# Patient Record
Sex: Male | Born: 2005 | Race: White | Hispanic: No | Marital: Single | Smoking: Never smoker
Health system: Southern US, Community
[De-identification: ages and names within clinical notes are randomized; demographics above are authoritative.]

## PROBLEM LIST (undated history)

## (undated) HISTORY — PX: MYRINGOTOMY: SUR874

## (undated) HISTORY — PX: TONSILLECTOMY: SUR1361

---
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
## 2023-01-03 ENCOUNTER — Other Ambulatory Visit: Payer: Self-pay | Admitting: Internal Medicine

## 2023-01-03 NOTE — Telephone Encounter (Signed)
Requested Prescriptions  Pending Prescriptions Disp Refills   guanFACINE (INTUNIV) 2 MG TB24 ER tablet [Pharmacy Med Name: GUANFACINE ER 2MG  TABLETS] 30 tablet 0    Sig: TAKE 1 TABLET(2 MG) BY MOUTH DAILY     Cardiovascular: Alpha-2 Agonists - guanfacine Passed - 01/03/2023 11:57 AM      Passed - Last BP in normal range    BP Readings from Last 1 Encounters:  12/04/22 106/64         Passed - Last Heart Rate in normal range    Pulse Readings from Last 1 Encounters:  12/04/22 97         Passed - Valid encounter within last 6 months    Recent Outpatient Visits           1 month ago Encounter for general adult medical examination with abnormal findings   [facility] [place], Salvadore Oxford, NP   7 months ago Attention deficit hyperactivity disorder (ADHD), combined type   [place] [facility] [place], Salvadore Oxford, NP       Future Appointments             In 5 months Baity, Salvadore Oxford, NP  [facility], [facility]

## 2023-01-08 DIAGNOSIS — F4310 Post-traumatic stress disorder, unspecified: Secondary | ICD-10-CM | POA: Diagnosis not present

## 2023-01-20 ENCOUNTER — Encounter: Payer: Self-pay | Admitting: Internal Medicine

## 2023-01-21 MED ORDER — METHYLPHENIDATE HCL ER (OSM) 36 MG PO TBCR: 72.0000 mg | EXTENDED_RELEASE_TABLET | Freq: Every day | ORAL | 0 refills | Status: DC

## 2023-02-19 DIAGNOSIS — F4310 Post-traumatic stress disorder, unspecified: Secondary | ICD-10-CM | POA: Diagnosis not present

## 2023-02-20 ENCOUNTER — Other Ambulatory Visit: Payer: Self-pay | Admitting: Internal Medicine

## 2023-02-20 MED ORDER — METHYLPHENIDATE HCL ER (OSM) 36 MG PO TBCR: 72.0000 mg | EXTENDED_RELEASE_TABLET | Freq: Every day | ORAL | 0 refills | Status: DC

## 2023-03-26 ENCOUNTER — Encounter: Payer: Self-pay | Admitting: Internal Medicine

## 2023-03-26 MED ORDER — METHYLPHENIDATE HCL ER (OSM) 36 MG PO TBCR: 72.0000 mg | EXTENDED_RELEASE_TABLET | Freq: Every day | ORAL | 0 refills | Status: DC

## 2023-03-28 ENCOUNTER — Encounter: Payer: Self-pay | Admitting: Internal Medicine

## 2023-03-28 DIAGNOSIS — M79672 Pain in left foot: Secondary | ICD-10-CM | POA: Insufficient documentation

## 2023-04-05 DIAGNOSIS — M79672 Pain in left foot: Secondary | ICD-10-CM | POA: Diagnosis not present

## 2023-04-25 ENCOUNTER — Encounter: Payer: Self-pay | Admitting: Internal Medicine

## 2023-04-25 MED ORDER — METHYLPHENIDATE HCL ER (OSM) 36 MG PO TBCR: 72.0000 mg | EXTENDED_RELEASE_TABLET | Freq: Every day | ORAL | 0 refills | Status: DC

## 2023-04-26 DIAGNOSIS — M79672 Pain in left foot: Secondary | ICD-10-CM | POA: Diagnosis not present

## 2023-05-24 ENCOUNTER — Encounter: Payer: Self-pay | Admitting: Internal Medicine

## 2023-05-27 MED ORDER — METHYLPHENIDATE HCL ER (OSM) 36 MG PO TBCR: 72.0000 mg | EXTENDED_RELEASE_TABLET | Freq: Every day | ORAL | 0 refills | Status: DC

## 2023-06-03 ENCOUNTER — Other Ambulatory Visit: Payer: Self-pay | Admitting: Internal Medicine

## 2023-06-04 NOTE — Telephone Encounter (Signed)
Requested Prescriptions  Pending Prescriptions Disp Refills   guanFACINE (INTUNIV) 2 MG TB24 ER tablet [Pharmacy Med Name: GUANFACINE ER 2MG  TABLETS] 30 tablet 2    Sig: TAKE 1 TABLET(2 MG) BY MOUTH DAILY     Cardiovascular: Alpha-2 Agonists - guanfacine Failed - 06/03/2023  7:04 AM      Failed - Valid encounter within last 6 months    Recent Outpatient Visits           6 months ago Encounter for general adult medical examination with abnormal findings   [place] [facility] [place], Salvadore Oxford, NP   12 months ago Attention deficit hyperactivity disorder (ADHD), combined type   [facility] [place], Salvadore Oxford, NP       Future Appointments             In 3 days [place] Si, Salvadore Oxford, NP [place] [facility], [facility]            Passed - Last BP in normal range    BP Readings from Last 1 Encounters:  12/04/22 106/64         Passed - Last Heart Rate in normal range    Pulse Readings from Last 1 Encounters:  12/04/22 97

## 2023-06-07 ENCOUNTER — Ambulatory Visit (INDEPENDENT_AMBULATORY_CARE_PROVIDER_SITE_OTHER): Payer: Medicaid Other | Admitting: Internal Medicine

## 2023-06-07 ENCOUNTER — Encounter: Payer: Self-pay | Admitting: Internal Medicine

## 2023-06-07 VITALS — BP 116/74 | HR 96 | Ht 68.0 in | Wt 221.2 lb

## 2023-06-07 DIAGNOSIS — M79671 Pain in right foot: Secondary | ICD-10-CM

## 2023-06-07 DIAGNOSIS — F902 Attention-deficit hyperactivity disorder, combined type: Secondary | ICD-10-CM

## 2023-06-07 DIAGNOSIS — Z6833 Body mass index (BMI) 33.0-33.9, adult: Secondary | ICD-10-CM | POA: Diagnosis not present

## 2023-06-07 DIAGNOSIS — F32A Depression, unspecified: Secondary | ICD-10-CM

## 2023-06-07 DIAGNOSIS — E66811 Obesity, class 1: Secondary | ICD-10-CM

## 2023-06-07 DIAGNOSIS — L700 Acne vulgaris: Secondary | ICD-10-CM

## 2023-06-07 DIAGNOSIS — K219 Gastro-esophageal reflux disease without esophagitis: Secondary | ICD-10-CM | POA: Diagnosis not present

## 2023-06-07 DIAGNOSIS — F419 Anxiety disorder, unspecified: Secondary | ICD-10-CM

## 2023-06-07 DIAGNOSIS — Z23 Encounter for immunization: Secondary | ICD-10-CM

## 2023-06-07 DIAGNOSIS — E6609 Other obesity due to excess calories: Secondary | ICD-10-CM | POA: Diagnosis not present

## 2023-06-07 NOTE — Assessment & Plan Note (Signed)
Encouraged diet and exercise for weight loss ?

## 2023-06-07 NOTE — Assessment & Plan Note (Signed)
Try to identify and avoid foods that trigger reflux Encourage weight loss as this can help reduce reflux symptoms Continue famotidine

## 2023-06-07 NOTE — Patient Instructions (Signed)
Self-Harming Behavior Information Self-harming behavior, also called non-suicidal self-injury (NSSI), is intentionally doing something to hurt yourself. This may include: Cutting yourself. Burning yourself. Biting or scratching yourself. Banging your head. Hitting yourself until you bruise. People may do this to cope with difficult emotions, and they may feel a sense of relief or pleasure after the self-injury. How can self-harming behavior affect me? Self-harming behaviors can damage your body and cause serious injuries. You may mistakenly cut yourself deeply or burn yourself badly. Serious cuts or burns may need immediate medical attention. These actions can also leave scars. Self-harming behavior may affect your performance at school, work, or Psychologist, counselling activities. It is also very likely to affect your relationships with others. Self-harming behavior can be a symptom of a mental health condition and this may put you at risk for thoughts of suicide in the future. Treatment may be required. How can self-harming behavior affect my loved ones? It is difficult and frightening to know that someone you love has hurt himself or herself. Your parents, friends, and teachers may: Not understand why you are hurting yourself. Fear that you want to commit suicide. Not know how to talk to you about your behaviors. Not know how to help you. What actions can I take to manage stress and emotions? Self-harm is often a sign that you need help coping with difficult emotions. To learn healthy ways of managing your emotions and feeling better, take these steps: Talk to people you trust  Talk to someone you trust. Tell your parents, a friend, a Runner, broadcasting/film/video, or a coach what you are doing. Ask for professional help. A mental health professional can help you stop hurting yourself. Meet regularly with your therapist. Find a support group. Manage your stress Find healthy ways to manage stress, such as: Deep  breathing. Yoga or meditation. Spending time in nature. Listening to music.  Express your emotions in a healthy way Find positive ways to express your emotions, such as: Talking to a friend. Journaling. Creating art or playing music. Seek help when you are in a crisis If you feel like you need to hurt yourself, call a crisis hotline: National Helpline: 1-800-SUICIDE 478 123 1524) National Suicide Prevention Lifeline: 1-800-273-TALK (484)747-2653) or 58 LGBT Youth Suicide Hotline: 1-866-4-U-TREVOR SAFE (Self-Abuse Finally Ends) Alternatives: 1-800-DONT-CUT (440-1027) or www.selfinjury.com Follow your health care provider's instructions Your health care provider or your counselor may give you more instructions. In general: Follow your treatment plan. Get regular exercise, and get enough sleep every night. Do not use drugs or alcohol. Keep all follow-up visits. This is important. Where to find more information Learn more about self-injury from: [employer] on Mental Illness: www.nami.org Centers for Disease Control and Prevention: FootballExhibition.com.br Get help right away if: You have thoughts of hurting yourself or others. If you ever feel like you may hurt yourself or others, or have thoughts about taking your own life, get help right away. Go to your nearest emergency department or: Call your local emergency services (911 in the [place] Call a suicide crisis helpline, such as the National Suicide Prevention Lifeline at (930) 224-3889 or 988 in the [place] This is open 24 hours a day in the [place] Text the Crisis Text Line at (616)335-0199 (in the [place] Summary Self-harming behavior, also called non-suicidal self-injury (NSSI), is intentionally doing something to hurt yourself. Self-harming behavior can be a symptom of a mental health condition and may put you at risk for thoughts of suicide in the future. Self-harming behaviors can damage your body and  cause serious injuries. If you feel like you need to  harm yourself, tell someone you trust and call a crisis center right away. This information is not intended to replace advice given to you by your health care provider. Make sure you discuss any questions you have with your health care provider. Document Revised: 02/02/2021 Document Reviewed: 11/17/2020 Elsevier Patient Education  2024 [employer].

## 2023-06-07 NOTE — Assessment & Plan Note (Signed)
Continue sertraline He will continue meet with his therapist and follow with a psychiatrist, but referral placed for a new therapist per his request Support offered

## 2023-06-07 NOTE — Assessment & Plan Note (Signed)
Not currently being treated at this time
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
## 2023-06-07 NOTE — Assessment & Plan Note (Signed)
Continue Concerta and Intuniv He will continue to follow with psychiatry

## 2023-06-25 ENCOUNTER — Other Ambulatory Visit: Payer: Self-pay | Admitting: Internal Medicine

## 2023-06-26 MED ORDER — METHYLPHENIDATE HCL ER (OSM) 36 MG PO TBCR: 72.0000 mg | EXTENDED_RELEASE_TABLET | Freq: Every day | ORAL | 0 refills | Status: DC

## 2023-07-25 ENCOUNTER — Encounter: Payer: Self-pay | Admitting: Internal Medicine

## 2023-07-26 MED ORDER — METHYLPHENIDATE HCL ER (OSM) 36 MG PO TBCR: 72.0000 mg | EXTENDED_RELEASE_TABLET | Freq: Every day | ORAL | 0 refills | Status: DC

## 2023-07-30 DIAGNOSIS — F4310 Post-traumatic stress disorder, unspecified: Secondary | ICD-10-CM | POA: Diagnosis not present

## 2023-08-08 ENCOUNTER — Other Ambulatory Visit: Payer: Self-pay | Admitting: Internal Medicine

## 2023-08-09 NOTE — Telephone Encounter (Signed)
Requested Prescriptions  Pending Prescriptions Disp Refills   cetirizine (ZYRTEC) 10 MG tablet [Pharmacy Med Name: CETIRIZINE 10MG  TABLETS] 90 tablet 1    Sig: TAKE 1 TABLET(10 MG) BY MOUTH AT BEDTIME     Ear, Nose, and Throat:  Antihistamines 2 Passed - 08/09/2023  9:11 AM      Passed - Cr in normal range and within 360 days    Creat  Date Value Ref Range Status  12/04/2022 0.87 0.60 - 1.24 mg/dL Final         Passed - Valid encounter within last 12 months    Recent Outpatient Visits           2 months ago Right foot pain   [place] [facility] [place], Salvadore Oxford, NP   8 months ago Encounter for general adult medical examination with abnormal findings   [facility] [place], Salvadore Oxford, NP   1 year ago Attention deficit hyperactivity disorder (ADHD), combined type   [place] [facility] [place], Salvadore Oxford, NP       Future Appointments             In 3 months Baity, Salvadore Oxford, NP [facility], [facility]

## 2023-08-27 DIAGNOSIS — F4310 Post-traumatic stress disorder, unspecified: Secondary | ICD-10-CM | POA: Diagnosis not present

## 2023-08-29 ENCOUNTER — Other Ambulatory Visit: Payer: Self-pay

## 2023-08-29 ENCOUNTER — Encounter: Payer: Self-pay | Admitting: Internal Medicine

## 2023-08-29 MED ORDER — CETIRIZINE HCL 10 MG PO TABS: 10.0000 mg | ORAL_TABLET | Freq: Every evening | ORAL | 1 refills | Status: DC

## 2023-08-29 MED ORDER — METHYLPHENIDATE HCL ER (OSM) 36 MG PO TBCR: 72.0000 mg | EXTENDED_RELEASE_TABLET | Freq: Every day | ORAL | 0 refills | Status: DC

## 2023-09-10 ENCOUNTER — Other Ambulatory Visit: Payer: Self-pay | Admitting: Internal Medicine

## 2023-09-10 NOTE — Telephone Encounter (Signed)
 Requested Prescriptions  Pending Prescriptions Disp Refills   guanFACINE (INTUNIV) 2 MG TB24 ER tablet [Pharmacy Med Name: GUANFACINE ER 2MG  TABLETS] 30 tablet 2    Sig: TAKE 1 TABLET(2 MG) BY MOUTH DAILY     Cardiovascular: Alpha-2 Agonists - guanfacine Passed - 09/10/2023  5:29 PM      Passed - Last BP in normal range    BP Readings from Last 1 Encounters:  06/07/23 116/74         Passed - Last Heart Rate in normal range    Pulse Readings from Last 1 Encounters:  06/07/23 96         Passed - Valid encounter within last 6 months    Recent Outpatient Visits           3 months ago Right foot pain   [place] [facility] [place], Salvadore Oxford, NP   9 months ago Encounter for general adult medical examination with abnormal findings   [facility] [place], Salvadore Oxford, NP   1 year ago Attention deficit hyperactivity disorder (ADHD), combined type   [place] [facility] [place], Salvadore Oxford, NP       Future Appointments             In 2 months Baity, Salvadore Oxford, NP  [facility], [facility]

## 2023-09-25 ENCOUNTER — Encounter: Payer: Self-pay | Admitting: Internal Medicine

## 2023-09-25 MED ORDER — METHYLPHENIDATE HCL ER (OSM) 36 MG PO TBCR: 72.0000 mg | EXTENDED_RELEASE_TABLET | Freq: Every day | ORAL | 0 refills | Status: DC

## 2023-10-28 ENCOUNTER — Encounter: Payer: Self-pay | Admitting: Internal Medicine

## 2023-10-28 MED ORDER — METHYLPHENIDATE HCL ER (OSM) 36 MG PO TBCR: 72.0000 mg | EXTENDED_RELEASE_TABLET | Freq: Every day | ORAL | 0 refills | Status: DC

## 2023-11-19 DIAGNOSIS — F4310 Post-traumatic stress disorder, unspecified: Secondary | ICD-10-CM | POA: Diagnosis not present

## 2023-11-28 ENCOUNTER — Encounter: Payer: Self-pay | Admitting: Internal Medicine

## 2023-11-28 MED ORDER — METHYLPHENIDATE HCL ER (OSM) 36 MG PO TBCR: 72.0000 mg | EXTENDED_RELEASE_TABLET | Freq: Every day | ORAL | 0 refills | Status: DC

## 2023-11-29 ENCOUNTER — Encounter (HOSPITAL_COMMUNITY): Payer: Self-pay

## 2023-12-06 ENCOUNTER — Encounter: Payer: Medicaid Other | Admitting: Internal Medicine

## 2023-12-06 ENCOUNTER — Other Ambulatory Visit: Payer: Self-pay | Admitting: Internal Medicine

## 2023-12-09 NOTE — Telephone Encounter (Signed)
 Courtesy refill Requested Prescriptions  Pending Prescriptions Disp Refills   guanFACINE  (INTUNIV ) 2 MG TB24 ER tablet [Pharmacy Med Name: GUANFACINE  ER 2MG  TABLETS] 90 tablet 0    Sig: TAKE 1 TABLET(2 MG) BY MOUTH DAILY     Cardiovascular: Alpha-2 Agonists - guanfacine  Failed - 12/09/2023  3:34 PM      Failed - Valid encounter within last 6 months    Recent Outpatient Visits   None            Passed - Last BP in normal range    BP Readings from Last 1 Encounters:  06/07/23 116/74         Passed - Last Heart Rate in normal range    Pulse Readings from Last 1 Encounters:  06/07/23 96

## 2023-12-10 ENCOUNTER — Ambulatory Visit (INDEPENDENT_AMBULATORY_CARE_PROVIDER_SITE_OTHER): Admitting: Internal Medicine

## 2023-12-10 ENCOUNTER — Encounter: Payer: Self-pay | Admitting: Internal Medicine

## 2023-12-10 VITALS — BP 118/74 | Ht 68.25 in | Wt 226.2 lb

## 2023-12-10 DIAGNOSIS — B353 Tinea pedis: Secondary | ICD-10-CM

## 2023-12-10 DIAGNOSIS — E6609 Other obesity due to excess calories: Secondary | ICD-10-CM

## 2023-12-10 DIAGNOSIS — E781 Pure hyperglyceridemia: Secondary | ICD-10-CM | POA: Diagnosis not present

## 2023-12-10 DIAGNOSIS — M79675 Pain in left toe(s): Secondary | ICD-10-CM

## 2023-12-10 DIAGNOSIS — Z0001 Encounter for general adult medical examination with abnormal findings: Secondary | ICD-10-CM | POA: Diagnosis not present

## 2023-12-10 DIAGNOSIS — R739 Hyperglycemia, unspecified: Secondary | ICD-10-CM

## 2023-12-10 DIAGNOSIS — E66811 Obesity, class 1: Secondary | ICD-10-CM | POA: Diagnosis not present

## 2023-12-10 DIAGNOSIS — Z6834 Body mass index (BMI) 34.0-34.9, adult: Secondary | ICD-10-CM | POA: Diagnosis not present

## 2023-12-10 MED ORDER — CLOTRIMAZOLE-BETAMETHASONE 1-0.05 % EX CREA: 1.0000 | TOPICAL_CREAM | Freq: Every day | CUTANEOUS | 2 refills | Status: DC

## 2023-12-10 NOTE — Assessment & Plan Note (Signed)
 Encouraged diet and exercise for weight loss ?

## 2023-12-10 NOTE — Patient Instructions (Signed)
 Health Maintenance, Male  Adopting a healthy lifestyle and getting preventive care are important in promoting health and wellness. Ask your health care provider about:  The right schedule for you to have regular tests and exams.  Things you can do on your own to prevent diseases and keep yourself healthy.  What should I know about diet, weight, and exercise?  Eat a healthy diet    Eat a diet that includes plenty of vegetables, fruits, low-fat dairy products, and lean protein.  Do not eat a lot of foods that are high in solid fats, added sugars, or sodium.  Maintain a healthy weight  Body mass index (BMI) is a measurement that can be used to identify possible weight problems. It estimates body fat based on height and weight. Your health care provider can help determine your BMI and help you achieve or maintain a healthy weight.  Get regular exercise  Get regular exercise. This is one of the most important things you can do for your health. Most adults should:  Exercise for at least 150 minutes each week. The exercise should increase your heart rate and make you sweat (moderate-intensity exercise).  Do strengthening exercises at least twice a week. This is in addition to the moderate-intensity exercise.  Spend less time sitting. Even light physical activity can be beneficial.  Watch cholesterol and blood lipids  Have your blood tested for lipids and cholesterol at 20 years of age, then have this test every 5 years.  You may need to have your cholesterol levels checked more often if:  Your lipid or cholesterol levels are high.  You are older than 20 years of age.  You are at high risk for heart disease.  What should I know about cancer screening?  Many types of cancers can be detected early and may often be prevented. Depending on your health history and family history, you may need to have cancer screening at various ages. This may include screening for:  Colorectal cancer.  Prostate cancer.  Skin cancer.  Lung  cancer.  What should I know about heart disease, diabetes, and high blood pressure?  Blood pressure and heart disease  High blood pressure causes heart disease and increases the risk of stroke. This is more likely to develop in people who have high blood pressure readings or are overweight.  Talk with your health care provider about your target blood pressure readings.  Have your blood pressure checked:  Every 3-5 years if you are 9-95 years of age.  Every year if you are 85 years old or older.  If you are between the ages of 29 and 29 and are a current or former smoker, ask your health care provider if you should have a one-time screening for abdominal aortic aneurysm (AAA).  Diabetes  Have regular diabetes screenings. This checks your fasting blood sugar level. Have the screening done:  Once every three years after age 23 if you are at a normal weight and have a low risk for diabetes.  More often and at a younger age if you are overweight or have a high risk for diabetes.  What should I know about preventing infection?  Hepatitis B  If you have a higher risk for hepatitis B, you should be screened for this virus. Talk with your health care provider to find out if you are at risk for hepatitis B infection.  Hepatitis C  Blood testing is recommended for:  Everyone born from [birth year] through 1967.  Anyone  with known risk factors for hepatitis C.  Sexually transmitted infections (STIs)  You should be screened each year for STIs, including gonorrhea and chlamydia, if:  You are sexually active and are younger than 20 years of age.  You are older than 20 years of age and your health care provider tells you that you are at risk for this type of infection.  Your sexual activity has changed since you were last screened, and you are at increased risk for chlamydia or gonorrhea. Ask your health care provider if you are at risk.  Ask your health care provider about whether you are at high risk for HIV. Your health care provider  may recommend a prescription medicine to help prevent HIV infection. If you choose to take medicine to prevent HIV, you should first get tested for HIV. You should then be tested every 3 months for as long as you are taking the medicine.  Follow these instructions at home:  Alcohol use  Do not drink alcohol if your health care provider tells you not to drink.  If you drink alcohol:  Limit how much you have to 0-2 drinks a day.  Know how much alcohol is in your drink. In the [place] one drink equals one 12 oz bottle of beer (355 mL), one 5 oz glass of wine (148 mL), or one 1 oz glass of hard liquor (44 mL).  Lifestyle  Do not use any products that contain nicotine or tobacco. These products include cigarettes, chewing tobacco, and vaping devices, such as e-cigarettes. If you need help quitting, ask your health care provider.  Do not use street drugs.  Do not share needles.  Ask your health care provider for help if you need support or information about quitting drugs.  General instructions  Schedule regular health, dental, and eye exams.  Stay current with your vaccines.  Tell your health care provider if:  You often feel depressed.  You have ever been abused or do not feel safe at home.  Summary  Adopting a healthy lifestyle and getting preventive care are important in promoting health and wellness.  Follow your health care provider's instructions about healthy diet, exercising, and getting tested or screened for diseases.  Follow your health care provider's instructions on monitoring your cholesterol and blood pressure.  This information is not intended to replace advice given to you by your health care provider. Make sure you discuss any questions you have with your health care provider.  Document Revised: 11/28/2020 Document Reviewed: 11/28/2020  Elsevier Patient Education  2024 [employer].

## 2023-12-10 NOTE — Progress Notes (Signed)
 Subjective:    Patient ID: Ross Mendoza, male    DOB: [birth year], 18 y.o.   MRN: [id number]  HPI  Patient presents to clinic today for his annual exam.  Flu: 05/2023 Tetanus: 07/2016 COVID: Never Dentist: biannually  Diet: He does eat meat. He consumes fruits and veggies. He tries to avoid fried foods. He drinks mostly water. Exercise: Gym 3-4 days  Review of Systems     No past medical history on file.  Current Outpatient Medications  Medication Sig Dispense Refill   ascorbic acid (VITAMIN C) 500 MG tablet Take 500 mg by mouth daily.     cetirizine  (ZYRTEC ) 10 MG tablet Take 1 tablet (10 mg total) by mouth at bedtime. 90 tablet 1   Clindamycin -Benzoyl Per, Refr, gel APPLY TOPICALLY TO THE AFFECTED AREA TWICE DAILY 45 g 0   guanFACINE  (INTUNIV ) 2 MG TB24 ER tablet TAKE 1 TABLET(2 MG) BY MOUTH DAILY 90 tablet 0   methylphenidate  (CONCERTA ) 36 MG PO CR tablet Take 2 tablets (72 mg total) by mouth daily. 60 tablet 0   sertraline (ZOLOFT) 50 MG tablet Take 100 mg by mouth daily.     No current facility-administered medications for this visit.    No Known Allergies  Family History  Problem Relation Age of Onset   Hypertension Mother    Diabetes Mother    Hyperlipidemia Mother    Depression Mother    Bipolar disorder Mother    Post-traumatic stress disorder Mother    Seizures Father    Diabetes Maternal Grandmother    Hypertension Maternal Grandmother    Heart attack Paternal Grandmother    Healthy Brother    Healthy Half-Sister     Social History   Socioeconomic History   Marital status: Single    Spouse name: Not on file   Number of children: Not on file   Years of education: Not on file   Highest education level: 12th grade  Occupational History   Not on file  Tobacco Use   Smoking status: Never   Smokeless tobacco: Never  Substance and Sexual Activity   Alcohol use: Never   Drug use: Not on file   Sexual activity: Not on file  Other Topics  Concern   Not on file  Social History Narrative   Not on file   Social Drivers of Health   Financial Resource Strain: Low Risk  (12/06/2023)   Overall Financial Resource Strain (CARDIA)    Difficulty of Paying Living Expenses: Not very hard  Food Insecurity: No Food Insecurity (12/06/2023)   Hunger Vital Sign    Worried About Running Out of Food in the Last Year: Never true    Ran Out of Food in the Last Year: Never true  Transportation Needs: No Transportation Needs (12/06/2023)   PRAPARE - Administrator, Civil Service (Medical): No    Lack of Transportation (Non-Medical): No  Physical Activity: Unknown (12/06/2023)   Exercise Vital Sign    Days of Exercise per Week: 0 days    Minutes of Exercise per Session: Not on file  Stress: Stress Concern Present (12/06/2023)   [employer] of Occupational Health - Occupational Stress Questionnaire    Feeling of Stress : To some extent  Social Connections: Socially Isolated (12/06/2023)   Social Connection and Isolation Panel [NHANES]    Frequency of Communication with Friends and Family: More than three times a week    Frequency of Social Gatherings with Friends and Family:  Once a week    Attends Religious Services: Never    Active Member of Clubs or Organizations: No    Attends Engineer, structural: Not on file    Marital Status: Never married  Intimate Partner Violence: Not on file     Constitutional: Denies fever, malaise, fatigue, headache or abrupt weight changes.  HEENT: Denies eye pain, eye redness, ear pain, ringing in the ears, wax buildup, runny nose, nasal congestion, bloody nose, or sore throat. Respiratory: Denies difficulty breathing, shortness of breath, cough or sputum production.   Cardiovascular: Denies chest pain, chest tightness, palpitations or swelling in the hands or feet.  Gastrointestinal: Patient reports intermittent reflux.  Denies abdominal pain, bloating, constipation, diarrhea or blood  in the stool.  GU: Denies urgency, frequency, pain with urination, burning sensation, blood in urine, odor or discharge. Musculoskeletal: Patient reports pain at the base of his left great toe.  Denies decrease in range of motion, difficulty with gait, muscle pain or joint pain and swelling.  Skin: Pt reports acne, peeling of feet. Denies redness, rashes, or ulcercations.  Neurological: Patient reports inattention.  Denies dizziness, difficulty with memory, difficulty with speech or problems with balance and coordination.  Psych: Patient has a history of anxiety and depression.  Denies SI/HI.  No other specific complaints in a complete review of systems (except as listed in HPI above).  Objective:   Physical Exam BP 118/74 (BP Location: Left Arm, Patient Position: Sitting, Cuff Size: Normal)   Ht 5' 8.25" (1.734 m)   Wt 226 lb 3.2 oz (102.6 kg)   BMI 34.14 kg/m    Wt Readings from Last 3 Encounters:  06/07/23 221 lb 3.2 oz (100.3 kg) (97%, Z= 1.92)*  12/04/22 205 lb (93 kg) (95%, Z= 1.63)*  06/05/22 186 lb (84.4 kg) (89%, Z= 1.23)*   * Growth percentiles are based on CDC (Boys, 2-20 Years) data.    General: Appears his stated age, obese, in NAD. Skin: Warm, dry and intact. Acne noted of face.  Fungal infection noted of bilateral heels. HEENT: Head: normal shape and size; Eyes: sclera white, no icterus, conjunctiva pink, PERRLA and EOMs intact;  Neck:  Neck supple, trachea midline. No masses, lumps or thyromegaly present.  Cardiovascular: Normal rate and rhythm. S1,S2 noted.  No murmur, rubs or gallops noted. No JVD or BLE edema.  Pulmonary/Chest: Normal effort and positive vesicular breath sounds. No respiratory distress. No wheezes, rales or ronchi noted.  Abdomen: Normal bowel sounds.  Musculoskeletal: Pain with palpation at the base of left great toe.  No obvious deformity noted.  Strength 5/5 BUE/BLE. No difficulty with gait.  Neurological: Alert and oriented. Cranial nerves  II-XII grossly intact. Coordination normal.  Psychiatric: Mood and affect normal. Behavior is normal. Judgment and thought content normal.         Assessment & Plan:   Preventative Health Maintenance:  Encouraged him to get a flu shot in the fall Tetanus UTD Encouraged him to get his COVID-vaccine Encouraged him to consume a balanced diet and exercise regimen Advised him to see a dentist annually We will check CBC, TSH,  c-Met, lipid, A1c today  Tinea pedis:  Rx for Lotrisone cream daily Encouraged him to keep the feet dry and free of moisture Can use medicated powder in the shoes to help with this and make sure to change socks as needed  Toe pain:  X-ray likely not of any benefit given lack of trauma We will continue to monitor for  now RTC in 6 months, follow-up chronic conditions Helayne Lo, NP

## 2023-12-11 ENCOUNTER — Ambulatory Visit: Payer: Self-pay | Admitting: Internal Medicine

## 2023-12-11 LAB — COMPREHENSIVE METABOLIC PANEL WITH GFR
AG Ratio: 1.8 (calc) (ref 1.0–2.5)
ALT: 21 U/L (ref 8–46)
AST: 17 U/L (ref 12–32)
Albumin: 4.9 g/dL (ref 3.6–5.1)
Alkaline phosphatase (APISO): 93 U/L (ref 46–169)
BUN: 13 mg/dL (ref 7–20)
CO2: 29 mmol/L (ref 20–32)
Calcium: 10.1 mg/dL (ref 8.9–10.4)
Chloride: 103 mmol/L (ref 98–110)
Creat: 0.84 mg/dL (ref 0.60–1.24)
Globulin: 2.8 g/dL (ref 2.1–3.5)
Glucose, Bld: 96 mg/dL (ref 65–139)
Potassium: 4.3 mmol/L (ref 3.8–5.1)
Sodium: 139 mmol/L (ref 135–146)
Total Bilirubin: 0.8 mg/dL (ref 0.2–1.1)
Total Protein: 7.7 g/dL (ref 6.3–8.2)
eGFR: 129 mL/min/{1.73_m2} (ref >=60)

## 2023-12-11 LAB — LIPID PANEL
Cholesterol: 158 mg/dL (ref <170)
HDL: 38 mg/dL — ABNORMAL LOW (ref >45)
LDL Cholesterol (Calc): 93 mg/dL (ref <110)
Non-HDL Cholesterol (Calc): 120 mg/dL — ABNORMAL HIGH (ref <120)
Total CHOL/HDL Ratio: 4.2 (calc) (ref <5.0)
Triglycerides: 173 mg/dL — ABNORMAL HIGH (ref <90)

## 2023-12-11 LAB — HEMOGLOBIN A1C
Hgb A1c MFr Bld: 5.4 % (ref <5.7)
Mean Plasma Glucose: 108 mg/dL
eAG (mmol/L): 6 mmol/L

## 2023-12-11 LAB — CBC
HCT: 45.4 % (ref 38.5–50.0)
Hemoglobin: 15.2 g/dL (ref 13.2–17.1)
MCH: 30.1 pg (ref 27.0–33.0)
MCHC: 33.5 g/dL (ref 32.0–36.0)
MCV: 89.9 fL (ref 80.0–100.0)
MPV: 12 fL (ref 7.5–12.5)
Platelets: 321 10*3/uL (ref 140–400)
RBC: 5.05 10*6/uL (ref 4.20–5.80)
RDW: 12.3 % (ref 11.0–15.0)
WBC: 7.4 10*3/uL (ref 3.8–10.8)

## 2023-12-19 ENCOUNTER — Ambulatory Visit (INDEPENDENT_AMBULATORY_CARE_PROVIDER_SITE_OTHER): Admitting: Internal Medicine

## 2023-12-19 ENCOUNTER — Encounter: Payer: Self-pay | Admitting: Internal Medicine

## 2023-12-19 VITALS — BP 100/70 | HR 87 | Temp 98.6°F | Ht 68.25 in | Wt 225.4 lb

## 2023-12-19 DIAGNOSIS — J00 Acute nasopharyngitis [common cold]: Secondary | ICD-10-CM

## 2023-12-19 MED ORDER — FLUTICASONE PROPIONATE 50 MCG/ACT NA SUSP: 2.0000 | Freq: Every day | NASAL | 3 refills | Status: DC

## 2023-12-19 MED ORDER — AZITHROMYCIN 250 MG PO TABS: ORAL_TABLET | ORAL | 0 refills | Status: DC

## 2023-12-19 NOTE — Patient Instructions (Signed)

## 2023-12-19 NOTE — Progress Notes (Signed)
 Subjective:    Patient ID: Ross Mendoza, male    DOB: 2006/03/30, 18 y.o.   MRN: [id number]  HPI  Discussed the use of AI scribe software for clinical note transcription with the patient, who gave verbal consent to proceed.  Ross Mendoza "Ross Mendoza" is a 18 year old male who presents with cough and difficulty breathing for one weeks.  He has been experiencing a persistent cough and difficulty breathing for the past four weeks, with episodes where it feels like his breathing stops. Despite taking over-the-counter cold and flu medication, there has been no relief. Symptoms are particularly severe at night, though he sometimes improves in the morning.  In addition to respiratory symptoms, he experiences severe headaches located in the right temple, sore throat, nasal congestion, and clear nasal discharge. No fever, but he notes chills and body aches. He has not been exposed to anyone who is sick and has not undergone any COVID testing.  His symptoms have significantly impacted his daily life, preventing him from attending school or taking exams. He takes Zyrtec  daily.       Review of Systems   No past medical history on file.  Current Outpatient Medications  Medication Sig Dispense Refill   ascorbic acid (VITAMIN C) 500 MG tablet Take 500 mg by mouth daily.     cetirizine  (ZYRTEC ) 10 MG tablet Take 1 tablet (10 mg total) by mouth at bedtime. 90 tablet 1   Clindamycin -Benzoyl Per, Refr, gel APPLY TOPICALLY TO THE AFFECTED AREA TWICE DAILY 45 g 0   clotrimazole-betamethasone (LOTRISONE) cream Apply 1 Application topically daily. 45 g 2   guanFACINE  (INTUNIV ) 2 MG TB24 ER tablet TAKE 1 TABLET(2 MG) BY MOUTH DAILY 90 tablet 0   methylphenidate  (CONCERTA ) 36 MG PO CR tablet Take 2 tablets (72 mg total) by mouth daily. 60 tablet 0   sertraline (ZOLOFT) 50 MG tablet Take 100 mg by mouth daily.     No current facility-administered medications for this visit.    No Known  Allergies  Family History  Problem Relation Age of Onset   Hypertension Mother    Diabetes Mother    Hyperlipidemia Mother    Depression Mother    Bipolar disorder Mother    Post-traumatic stress disorder Mother    Seizures Father    Diabetes Maternal Grandmother    Hypertension Maternal Grandmother    Heart attack Paternal Grandmother    Healthy Brother    Healthy Half-Sister     Social History   Socioeconomic History   Marital status: Single    Spouse name: Not on file   Number of children: Not on file   Years of education: Not on file   Highest education level: 12th grade  Occupational History   Not on file  Tobacco Use   Smoking status: Never   Smokeless tobacco: Never  Substance and Sexual Activity   Alcohol use: Never   Drug use: Not on file   Sexual activity: Not on file  Other Topics Concern   Not on file  Social History Narrative   Not on file   Social Drivers of Health   Financial Resource Strain: Low Risk  (12/06/2023)   Overall Financial Resource Strain (CARDIA)    Difficulty of Paying Living Expenses: Not very hard  Food Insecurity: No Food Insecurity (12/06/2023)   Hunger Vital Sign    Worried About Running Out of Food in the Last Year: Never true    Ran Out of  Food in the Last Year: Never true  Transportation Needs: No Transportation Needs (12/06/2023)   PRAPARE - Administrator, Civil Service (Medical): No    Lack of Transportation (Non-Medical): No  Physical Activity: Unknown (12/06/2023)   Exercise Vital Sign    Days of Exercise per Week: 0 days    Minutes of Exercise per Session: Not on file  Stress: Stress Concern Present (12/06/2023)   [employer] of Occupational Health - Occupational Stress Questionnaire    Feeling of Stress : To some extent  Social Connections: Socially Isolated (12/06/2023)   Social Connection and Isolation Panel [NHANES]    Frequency of Communication with Friends and Family: More than three times a week     Frequency of Social Gatherings with Friends and Family: Once a week    Attends Religious Services: Never    Database administrator or Organizations: No    Attends Engineer, structural: Not on file    Marital Status: Never married  Intimate Partner Violence: Not on file     Constitutional: Pt reports chills. Denies fever, malaise, fatigue, headache or abrupt weight changes.  HEENT: Pt reports nasal congestion and sore throat Denies eye pain, eye redness, ear pain, ringing in the ears, wax buildup, runny nose, bloody nose. Respiratory: Pt reports cough and shortness of breath. Denies difficulty breathing, or sputum production.   Cardiovascular: Denies chest pain, chest tightness, palpitations or swelling in the hands or feet.  Gastrointestinal: Denies abdominal pain, bloating, constipation, diarrhea or blood in the stool.  GU: Denies urgency, frequency, pain with urination, burning sensation, blood in urine, odor or discharge. Musculoskeletal: Pt reports body aches. Denies decrease in range of motion, difficulty with gait, or joint swelling.  Skin: Denies redness, rashes, lesions or ulcercations.   No other specific complaints in a complete review of systems (except as listed in HPI above).      Objective:   Physical Exam  BP 100/70 (BP Location: Right Arm, Patient Position: Sitting, Cuff Size: Large)   Pulse 87   Temp 98.6 F (37 C)   Ht 5' 8.25" (1.734 m)   Wt 225 lb 6 oz (102.2 kg)   SpO2 96%   BMI 34.02 kg/m   Wt Readings from Last 3 Encounters:  12/10/23 226 lb 3.2 oz (102.6 kg) (98%, Z= 1.99)*  06/07/23 221 lb 3.2 oz (100.3 kg) (97%, Z= 1.92)*  12/04/22 205 lb (93 kg) (95%, Z= 1.63)*   * Growth percentiles are based on CDC (Boys, 2-20 Years) data.    General: Appears his stated age, obese, appears unwell but in NAD. Skin: Warm, dry and intact. No rashes noted. HEENT: Head: normal shape and size, no sinus tenderness noted; Eyes: sclera white, no icterus,  conjunctiva pink, PERRLA and EOMs intact; Ears: Tm's gray and intact, normal light reflex; Nose: mucosa boggy and moist, turbinates swollen; Throat/Mouth: Teeth present, mucosa erythematous and moist, + PND, no exudate, lesions or ulcerations noted.  Neck: Bilateral anterior cervical adenopathy noted. Cardiovascular: Normal rate and rhythm. S1,S2 noted.  No murmur, rubs or gallops noted.  Pulmonary/Chest: Normal effort and positive vesicular breath sounds. No respiratory distress. No wheezes, rales or ronchi noted.  Neurological: Alert and oriented.   BMET    Component Value Date/Time   NA 139 12/10/2023 1531   K 4.3 12/10/2023 1531   CL 103 12/10/2023 1531   CO2 29 12/10/2023 1531   GLUCOSE 96 12/10/2023 1531   BUN 13 12/10/2023 1531  CREATININE 0.84 12/10/2023 1531   CALCIUM 10.1 12/10/2023 1531    Lipid Panel     Component Value Date/Time   CHOL 158 12/10/2023 1531   TRIG 173 (H) 12/10/2023 1531   HDL 38 (L) 12/10/2023 1531   CHOLHDL 4.2 12/10/2023 1531   LDLCALC 93 12/10/2023 1531    CBC    Component Value Date/Time   WBC 7.4 12/10/2023 1531   RBC 5.05 12/10/2023 1531   HGB 15.2 12/10/2023 1531   HCT 45.4 12/10/2023 1531   PLT 321 12/10/2023 1531   MCV 89.9 12/10/2023 1531   MCH 30.1 12/10/2023 1531   MCHC 33.5 12/10/2023 1531   RDW 12.3 12/10/2023 1531    Hgb A1C Lab Results  Component Value Date   HGBA1C 5.4 12/10/2023            Assessment & Plan:  Assessment and Plan    Upper respiratory infection Symptoms suggest bacterial etiology due to duration. RSV discussed but not typically tested in young adults. Antibiotic treatment warranted. -Encourage rest and fluids - Prescribe azithromycin 250 mg  for 5 days. - Prescribe flonase nasal spray, one squirt in each nostril for a week. - Continue Zyrtec  daily. - Use OTC cough syrup as needed. -Work note provided - Advise to report back by Monday if symptoms do not improve.   RTC in 6 months for  follow-up of chronic conditions Helayne Lo, NP

## 2024-01-10 ENCOUNTER — Other Ambulatory Visit: Payer: Self-pay

## 2024-01-10 MED ORDER — METHYLPHENIDATE HCL ER (OSM) 36 MG PO TBCR: 72.0000 mg | EXTENDED_RELEASE_TABLET | Freq: Every day | ORAL | 0 refills | Status: DC

## 2024-01-10 NOTE — Telephone Encounter (Signed)
 Can you send a prescription for Ross Mendoza to [facility] please. We are having trouble accessing his MyChart page since he changed his phone number. Thanks.Aaron AasAaron AasAaron AasStana Ear

## 2024-02-17 ENCOUNTER — Encounter: Payer: Self-pay | Admitting: Internal Medicine

## 2024-02-18 MED ORDER — METHYLPHENIDATE HCL ER (OSM) 36 MG PO TBCR: 72.0000 mg | EXTENDED_RELEASE_TABLET | Freq: Every day | ORAL | 0 refills | Status: DC

## 2024-03-17 ENCOUNTER — Encounter: Payer: Self-pay | Admitting: Internal Medicine

## 2024-03-18 ENCOUNTER — Other Ambulatory Visit: Payer: Self-pay | Admitting: Internal Medicine

## 2024-03-18 DIAGNOSIS — F4310 Post-traumatic stress disorder, unspecified: Secondary | ICD-10-CM | POA: Diagnosis not present

## 2024-03-18 MED ORDER — GUANFACINE HCL ER 2 MG PO TB24: 2.0000 mg | ORAL_TABLET | Freq: Every day | ORAL | 0 refills | Status: DC

## 2024-03-18 MED ORDER — METHYLPHENIDATE HCL ER (OSM) 36 MG PO TBCR
72.0000 mg | EXTENDED_RELEASE_TABLET | Freq: Every day | ORAL | 0 refills | Status: DC
Start: 2024-03-18 — End: 2024-04-23

## 2024-03-19 NOTE — Telephone Encounter (Signed)
 Requested Prescriptions  Pending Prescriptions Disp Refills   fluticasone  (FLONASE ) 50 MCG/ACT nasal spray [Pharmacy Med Name: FLUTICASONE  NASAL SP (120) RX] 48 g 0    Sig: USE 2 SPRAYS IN EACH NOSTRIL ONCE FOR 4 TO 6 WEEKS THEN STOP AND USE SEASONALLY OR AS NEEDED     Ear, Nose, and Throat: Nasal Preparations - Corticosteroids Passed - 03/19/2024  5:54 PM      Passed - Valid encounter within last 12 months    Recent Outpatient Visits           3 months ago Acute nasopharyngitis   [place] [facility] [place], Angeline ORN, NP   3 months ago Encounter for general adult medical examination with abnormal findings   [place] [facility] [place], Angeline ORN, NP

## 2024-04-09 DIAGNOSIS — F902 Attention-deficit hyperactivity disorder, combined type: Secondary | ICD-10-CM | POA: Diagnosis not present

## 2024-04-14 DIAGNOSIS — F902 Attention-deficit hyperactivity disorder, combined type: Secondary | ICD-10-CM | POA: Diagnosis not present

## 2024-04-17 ENCOUNTER — Encounter: Payer: Self-pay | Admitting: Internal Medicine

## 2024-04-17 MED ORDER — CETIRIZINE HCL 10 MG PO TABS: 10.0000 mg | ORAL_TABLET | Freq: Every evening | ORAL | 1 refills | Status: AC

## 2024-04-22 ENCOUNTER — Encounter: Payer: Self-pay | Admitting: Internal Medicine

## 2024-04-23 MED ORDER — METHYLPHENIDATE HCL ER (OSM) 36 MG PO TBCR: 72.0000 mg | EXTENDED_RELEASE_TABLET | Freq: Every day | ORAL | 0 refills | Status: DC

## 2024-04-28 DIAGNOSIS — F4310 Post-traumatic stress disorder, unspecified: Secondary | ICD-10-CM | POA: Diagnosis not present

## 2024-05-19 DIAGNOSIS — F902 Attention-deficit hyperactivity disorder, combined type: Secondary | ICD-10-CM | POA: Diagnosis not present

## 2024-05-24 ENCOUNTER — Encounter: Payer: Self-pay | Admitting: Internal Medicine

## 2024-05-25 ENCOUNTER — Other Ambulatory Visit: Payer: Self-pay

## 2024-05-25 MED ORDER — METHYLPHENIDATE HCL ER (OSM) 36 MG PO TBCR: 72.0000 mg | EXTENDED_RELEASE_TABLET | Freq: Every day | ORAL | 0 refills | Status: DC

## 2024-06-11 ENCOUNTER — Ambulatory Visit: Admitting: Internal Medicine

## 2024-06-11 ENCOUNTER — Encounter: Payer: Self-pay | Admitting: Internal Medicine

## 2024-06-11 VITALS — BP 118/72 | Ht 68.25 in | Wt 199.6 lb

## 2024-06-11 DIAGNOSIS — Z23 Encounter for immunization: Secondary | ICD-10-CM

## 2024-06-11 DIAGNOSIS — F32A Depression, unspecified: Secondary | ICD-10-CM | POA: Diagnosis not present

## 2024-06-11 DIAGNOSIS — Z6830 Body mass index (BMI) 30.0-30.9, adult: Secondary | ICD-10-CM | POA: Diagnosis not present

## 2024-06-11 DIAGNOSIS — L700 Acne vulgaris: Secondary | ICD-10-CM | POA: Diagnosis not present

## 2024-06-11 DIAGNOSIS — E66811 Obesity, class 1: Secondary | ICD-10-CM | POA: Diagnosis not present

## 2024-06-11 DIAGNOSIS — G8929 Other chronic pain: Secondary | ICD-10-CM

## 2024-06-11 DIAGNOSIS — E781 Pure hyperglyceridemia: Secondary | ICD-10-CM | POA: Diagnosis not present

## 2024-06-11 DIAGNOSIS — K219 Gastro-esophageal reflux disease without esophagitis: Secondary | ICD-10-CM | POA: Diagnosis not present

## 2024-06-11 DIAGNOSIS — F902 Attention-deficit hyperactivity disorder, combined type: Secondary | ICD-10-CM | POA: Diagnosis not present

## 2024-06-11 DIAGNOSIS — M545 Low back pain, unspecified: Secondary | ICD-10-CM

## 2024-06-11 DIAGNOSIS — F419 Anxiety disorder, unspecified: Secondary | ICD-10-CM

## 2024-06-11 DIAGNOSIS — E6609 Other obesity due to excess calories: Secondary | ICD-10-CM

## 2024-06-11 MED ORDER — SERTRALINE HCL 50 MG PO TABS: 100.0000 mg | ORAL_TABLET | Freq: Every day | ORAL | 1 refills | Status: AC

## 2024-06-11 NOTE — Assessment & Plan Note (Signed)
 Complicated by obesity Try to identify and avoid foods that trigger reflux Encourage weight loss as this can help reduce reflux symptoms Continue tums OTC as needed

## 2024-06-11 NOTE — Assessment & Plan Note (Addendum)
 Complicated by obesity Lipid profile reviewed Encouraged him to consume a low-fat diet

## 2024-06-11 NOTE — Assessment & Plan Note (Signed)
Not currently being treated at this time

## 2024-06-11 NOTE — Patient Instructions (Signed)

## 2024-06-11 NOTE — Assessment & Plan Note (Signed)
 Encouraged diet and exercise for weight loss ?

## 2024-06-11 NOTE — Progress Notes (Signed)
 Subjective:    Subjective Patient ID: Ross Mendoza, male    DOB: 30-May-2006, 18 y.o.   MRN: [id number]  HPI  Patient presents to clinic today for 72-month follow-up of chronic conditions.  ADHD: He reports mainly difficulty focusing.  He is taking methylphenidate  and intuniv  as prescribed.  He does follow with psychiatry.  GERD: He is not sure what triggers this.  He takes tums OTC as needed with good relief of symptoms.  There is no upper GI on file.  Anxiety, Depression, PTSD: Managed on sertraline.  He is currently seeing a therapist and psychiatrist.  He denies SI/HI.  HLD: Her last LDL was 93, triglycerides 826, 11/2023.  He is not taking any cholesterol-lowering medication at this time.  He does not consume a low-fat diet.  He also reports back pain. He reports he had an issue with this about 5 years ago. He reports pain in the lower back. He describes the pain as sore and achy at times but can be sharp and stabbing. The pain does not radiate down his legs. He has had imaging and tried physical therapy in the past. He has not seen an orthopedist but is interested in seeing a chiropractor.  No past medical history on file.  Current Outpatient Medications  Medication Sig Dispense Refill   ascorbic acid (VITAMIN C) 500 MG tablet Take 500 mg by mouth daily.     azithromycin  (ZITHROMAX ) 250 MG tablet Take 2 tabs today, then 1 tab daily x 4 days 6 tablet 0   cetirizine  (ZYRTEC ) 10 MG tablet Take 1 tablet (10 mg total) by mouth at bedtime. 90 tablet 1   Clindamycin -Benzoyl Per, Refr, gel APPLY TOPICALLY TO THE AFFECTED AREA TWICE DAILY 45 g 0   clotrimazole -betamethasone  (LOTRISONE ) cream Apply 1 Application topically daily. 45 g 2   fluticasone  (FLONASE ) 50 MCG/ACT nasal spray USE 2 SPRAYS IN EACH NOSTRIL ONCE FOR 4 TO 6 WEEKS THEN STOP AND USE SEASONALLY OR AS NEEDED 48 g 0   guanFACINE  (INTUNIV ) 2 MG TB24 ER tablet Take 1 tablet (2 mg total) by mouth daily. 90 tablet 0    methylphenidate  (CONCERTA ) 36 MG PO CR tablet Take 2 tablets (72 mg total) by mouth daily. 60 tablet 0   sertraline (ZOLOFT) 50 MG tablet Take 100 mg by mouth daily.     No current facility-administered medications for this visit.    No Known Allergies  Family History  Problem Relation Age of Onset   Hypertension Mother    Diabetes Mother    Hyperlipidemia Mother    Depression Mother    Bipolar disorder Mother    Post-traumatic stress disorder Mother    Seizures Father    Diabetes Maternal Grandmother    Hypertension Maternal Grandmother    Heart attack Paternal Grandmother    Healthy Brother    Healthy Half-Sister     Social History   Socioeconomic History   Marital status: Single    Spouse name: Not on file   Number of children: Not on file   Years of education: Not on file   Highest education level: 12th grade  Occupational History   Not on file  Tobacco Use   Smoking status: Never   Smokeless tobacco: Never  Substance and Sexual Activity   Alcohol use: Never   Drug use: Not on file   Sexual activity: Not on file  Other Topics Concern   Not on file  Social History Narrative  Not on file   Social Drivers of Health   Financial Resource Strain: Patient Declined (06/10/2024)   Overall Financial Resource Strain (CARDIA)    Difficulty of Paying Living Expenses: Patient declined  Food Insecurity: Patient Declined (06/10/2024)   Hunger Vital Sign    Worried About Running Out of Food in the Last Year: Patient declined    Ran Out of Food in the Last Year: Patient declined  Transportation Needs: No Transportation Needs (06/10/2024)   PRAPARE - Administrator, Civil Service (Medical): No    Lack of Transportation (Non-Medical): No  Physical Activity: Insufficiently Active (06/10/2024)   Exercise Vital Sign    Days of Exercise per Week: 1 day    Minutes of Exercise per Session: 30 min  Stress: No Stress Concern Present (06/10/2024)   [employer]  [employer] Occupational [employer] - Occupational Stress Questionnaire    Feeling of Stress: Only a little  Social Connections: Socially Isolated (06/10/2024)   Social Connection and Isolation Panel    Frequency of Communication with Friends and Family: Twice a week    Frequency of Social Gatherings with Friends and Family: Once a week    Attends Religious Services: Never    Database Administrator or Organizations: No    Attends Engineer, Structural: Not on file    Marital Status: Never married  Intimate Partner Violence: Not on file    ROS:  Constitutional: Denies fever, malaise, fatigue, headache or abrupt weight changes.  HEENT: Denies eye pain, eye redness, ear pain, ringing in the ears, wax buildup, runny nose, nasal congestion, bloody nose, or sore throat. Respiratory: Denies difficulty breathing, shortness of breath, cough or sputum production.   Cardiovascular: Denies chest pain, chest tightness, palpitations or swelling in the hands or feet.  Gastrointestinal: Denies abdominal pain, bloating, constipation, diarrhea or blood in the stool.  GU: Denies frequency, urgency, pain with urination, blood in urine, odor or discharge. Musculoskeletal: Patient reports back pain.  Denies decrease in range of motion, difficulty with gait, muscle pain or joint swelling.  Skin: Denies redness, rashes, lesions or ulcercations.  Neurological: Pt reports difficulty focusing. Denies dizziness, difficulty with memory, difficulty with speech or problems with balance and coordination.  Psych: Pt has a history of anxiety and depression. Denies SI/HI.  No other specific complaints in a complete review of systems (except as listed in HPI above).  PE: BP 118/72 (BP Location: Right Arm, Patient Position: Sitting, Cuff Size: Normal)   Ht 5' 8.25 (1.734 m)   Wt 199 lb 9.6 oz (90.5 kg)   BMI 30.13 kg/m    Wt Readings from Last 3 Encounters:  12/19/23 225 lb 6 oz (102.2 kg) (98%, Z= 1.97)*  12/10/23 226  lb 3.2 oz (102.6 kg) (98%, Z= 1.99)*  06/07/23 221 lb 3.2 oz (100.3 kg) (97%, Z= 1.92)*   * Growth percentiles are based on CDC (Boys, 2-20 Years) data.    General: Appears his stated age, obese, in NAD. Skin: Acne noted on face. Linear scarring noted to left wrist and forearm. HEENT: Head: normal shape and size; Eyes: sclera white, no icterus, conjunctiva pink, PERRLA and EOMs intact;  Cardiovascular: Normal rate and rhythm. S1,S2 noted.  No murmur, rubs or gallops noted. No JVD or BLE edema. No carotid bruits noted. Pulmonary/Chest: Normal effort and positive vesicular breath sounds. No respiratory distress. No wheezes, rales or ronchi noted.  Abdomen: Soft and nontender. Normal bowel sounds. Musculoskeletal: No pain with palpation over the  lumbar spine.  Strength 5/5 BLE.  No difficulty with gait.  Neurological: Alert and oriented. Cranial nerves II-XII grossly intact. Coordination normal.  Psychiatric: Mood and affect normal. Behavior is normal. Judgment and thought content normal.   CBC    Component Value Date/Time   WBC 7.4 12/10/2023 1531   RBC 5.05 12/10/2023 1531   HGB 15.2 12/10/2023 1531   HCT 45.4 12/10/2023 1531   PLT 321 12/10/2023 1531   MCV 89.9 12/10/2023 1531   MCH 30.1 12/10/2023 1531   MCHC 33.5 12/10/2023 1531   RDW 12.3 12/10/2023 1531      Latest Ref Rng & Units 12/10/2023    3:31 PM 12/04/2022    8:23 AM  CMP  Glucose 65 - 139 mg/dL 96  896   BUN 7 - 20 mg/dL 13  17   Creatinine 9.39 - 1.24 mg/dL 9.15  9.12   Sodium 864 - 146 mmol/L 139  141   Potassium 3.8 - 5.1 mmol/L 4.3  4.5   Chloride 98 - 110 mmol/L 103  105   CO2 20 - 32 mmol/L 29  25   Calcium 8.9 - 10.4 mg/dL 89.8  89.8   Total Protein 6.3 - 8.2 g/dL 7.7  7.5   Total Bilirubin 0.2 - 1.1 mg/dL 0.8  0.7   AST 12 - 32 U/L 17  33   ALT 8 - 46 U/L 21  47    Lipid Panel     Component Value Date/Time   CHOL 158 12/10/2023 1531   TRIG 173 (H) 12/10/2023 1531   HDL 38 (L) 12/10/2023 1531    CHOLHDL 4.2 12/10/2023 1531   LDLCALC 93 12/10/2023 1531   Lab Results  Component Value Date   HGBA1C 5.4 12/10/2023   HGBA1C 5.3 12/04/2022     Assessment and Plan:   RTC in 6 months for your annual exam Angeline Laura, NP

## 2024-06-11 NOTE — Assessment & Plan Note (Signed)
 Continue methylphenidate  36 mg and guanfacine  2 mg daily He will continue to follow with psychiatry

## 2024-06-11 NOTE — Assessment & Plan Note (Signed)
 Continue sertraline  100 mg daily He will continue meet with his therapist and follow with a psychiatrist Support offered

## 2024-06-11 NOTE — Assessment & Plan Note (Signed)
 Complicated by obesity Encouraged regular stretching and core strengthening Encourage weight loss as this can produce back pain Referral to chiropractor placed

## 2024-06-16 DIAGNOSIS — F902 Attention-deficit hyperactivity disorder, combined type: Secondary | ICD-10-CM | POA: Diagnosis not present

## 2024-06-24 ENCOUNTER — Encounter: Payer: Self-pay | Admitting: Internal Medicine

## 2024-06-25 MED ORDER — METHYLPHENIDATE HCL ER (OSM) 36 MG PO TBCR: 72.0000 mg | EXTENDED_RELEASE_TABLET | Freq: Every day | ORAL | 0 refills | Status: DC

## 2024-06-25 MED ORDER — GUANFACINE HCL ER 2 MG PO TB24: 2.0000 mg | ORAL_TABLET | Freq: Every day | ORAL | 0 refills | Status: AC

## 2024-07-01 DIAGNOSIS — F902 Attention-deficit hyperactivity disorder, combined type: Secondary | ICD-10-CM | POA: Diagnosis not present

## 2024-07-29 ENCOUNTER — Encounter: Payer: Self-pay | Admitting: Internal Medicine

## 2024-07-30 MED ORDER — METHYLPHENIDATE HCL ER (OSM) 36 MG PO TBCR: 72.0000 mg | EXTENDED_RELEASE_TABLET | Freq: Every day | ORAL | 0 refills | Status: DC

## 2024-08-03 ENCOUNTER — Telehealth: Payer: Self-pay

## 2024-08-03 ENCOUNTER — Other Ambulatory Visit (HOSPITAL_COMMUNITY): Payer: Self-pay

## 2024-08-25 ENCOUNTER — Encounter: Payer: Self-pay | Admitting: Internal Medicine

## 2024-08-27 MED ORDER — METHYLPHENIDATE HCL ER (OSM) 36 MG PO TBCR: 72.0000 mg | EXTENDED_RELEASE_TABLET | Freq: Every day | ORAL | 0 refills | Status: AC

## 2024-12-10 ENCOUNTER — Encounter: Admitting: Internal Medicine
